# Patient Record
Sex: Male | Born: 1990 | Race: Black or African American | Hispanic: No | Marital: Single | Smoking: Never smoker
Health system: Southern US, Community
[De-identification: ages and names within clinical notes are randomized; demographics above are authoritative.]

## PROBLEM LIST (undated history)

## (undated) DIAGNOSIS — A749 Chlamydial infection, unspecified: Secondary | ICD-10-CM

## (undated) DIAGNOSIS — R062 Wheezing: Secondary | ICD-10-CM

## (undated) HISTORY — DX: Chlamydial infection, unspecified: A74.9

## (undated) HISTORY — DX: Wheezing: R06.2

---
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
## 2010-05-15 IMAGING — CR DG WRIST COMPLETE 3+V*R*
2 series · 2 of 2 positions shown · non-contrast
Comparison: None

CLINICAL DATA: right wrist injury, fall

RIGHT WRIST - COMPLETE 3+ VIEW

[view not recorded (1 of 2)]
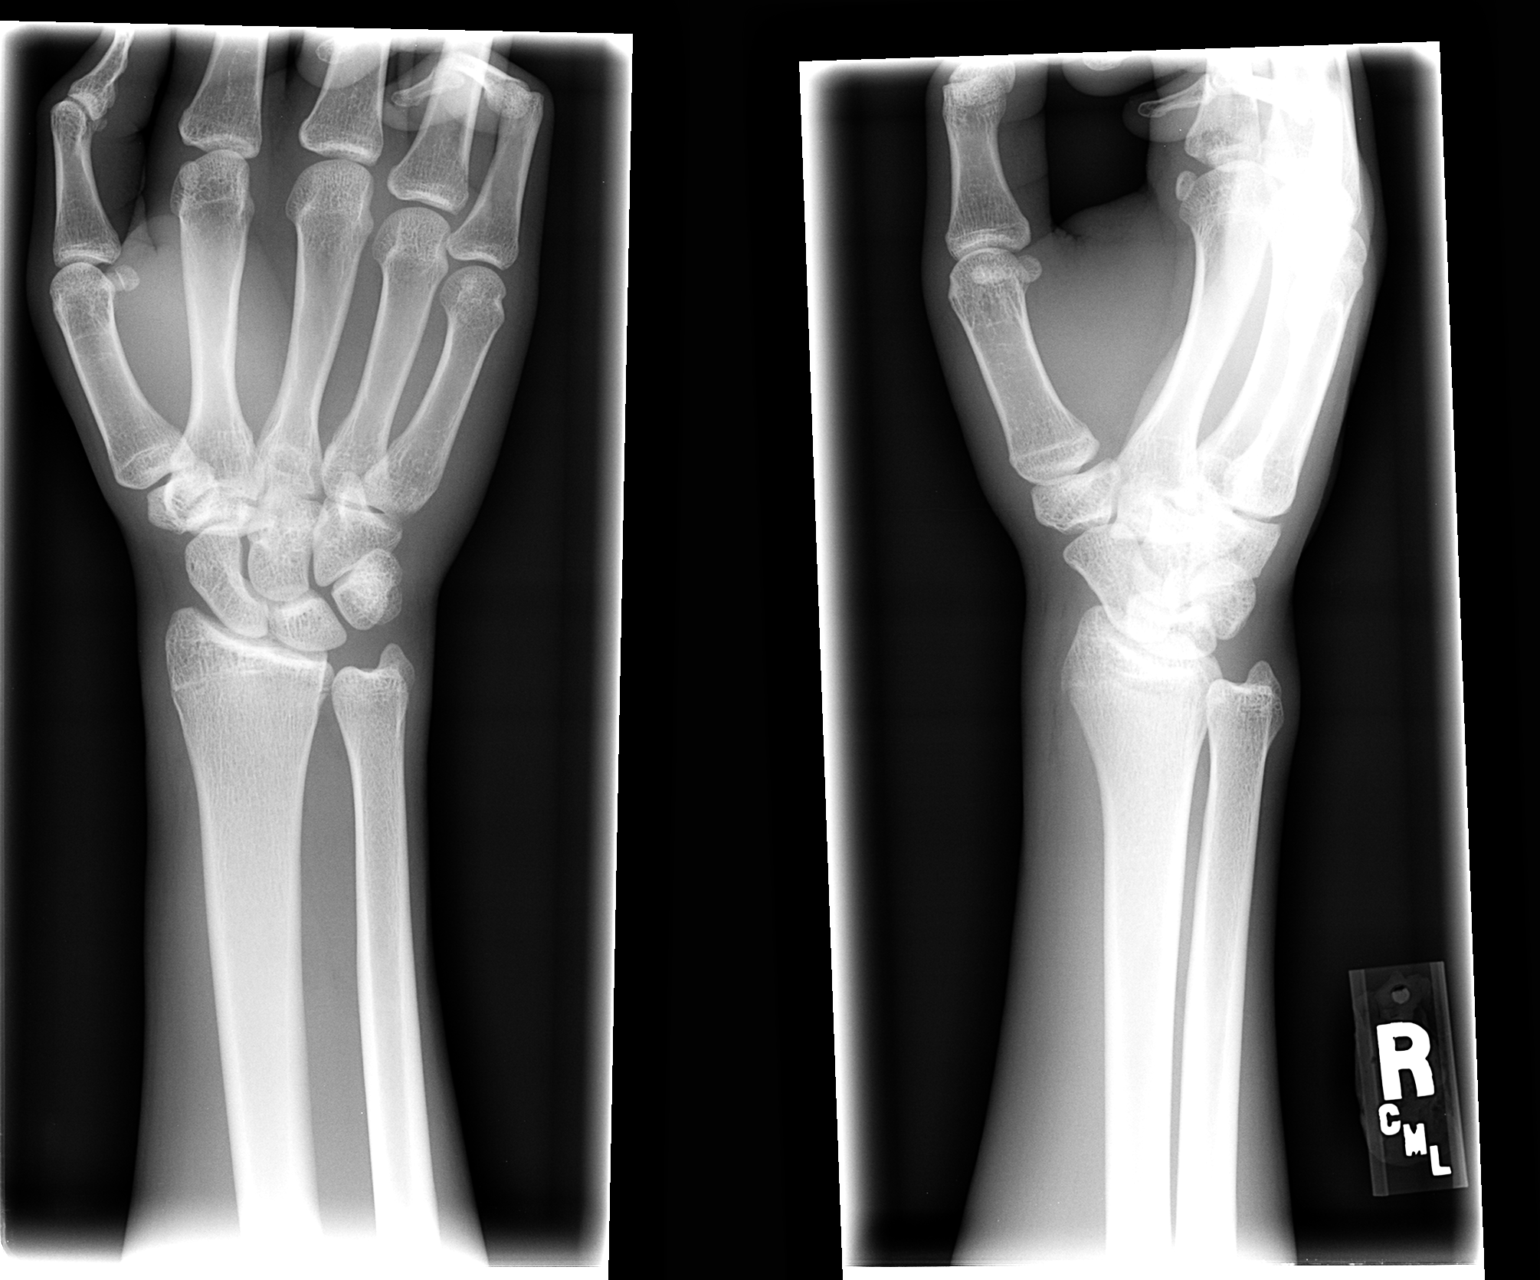

[view not recorded (2 of 2)]
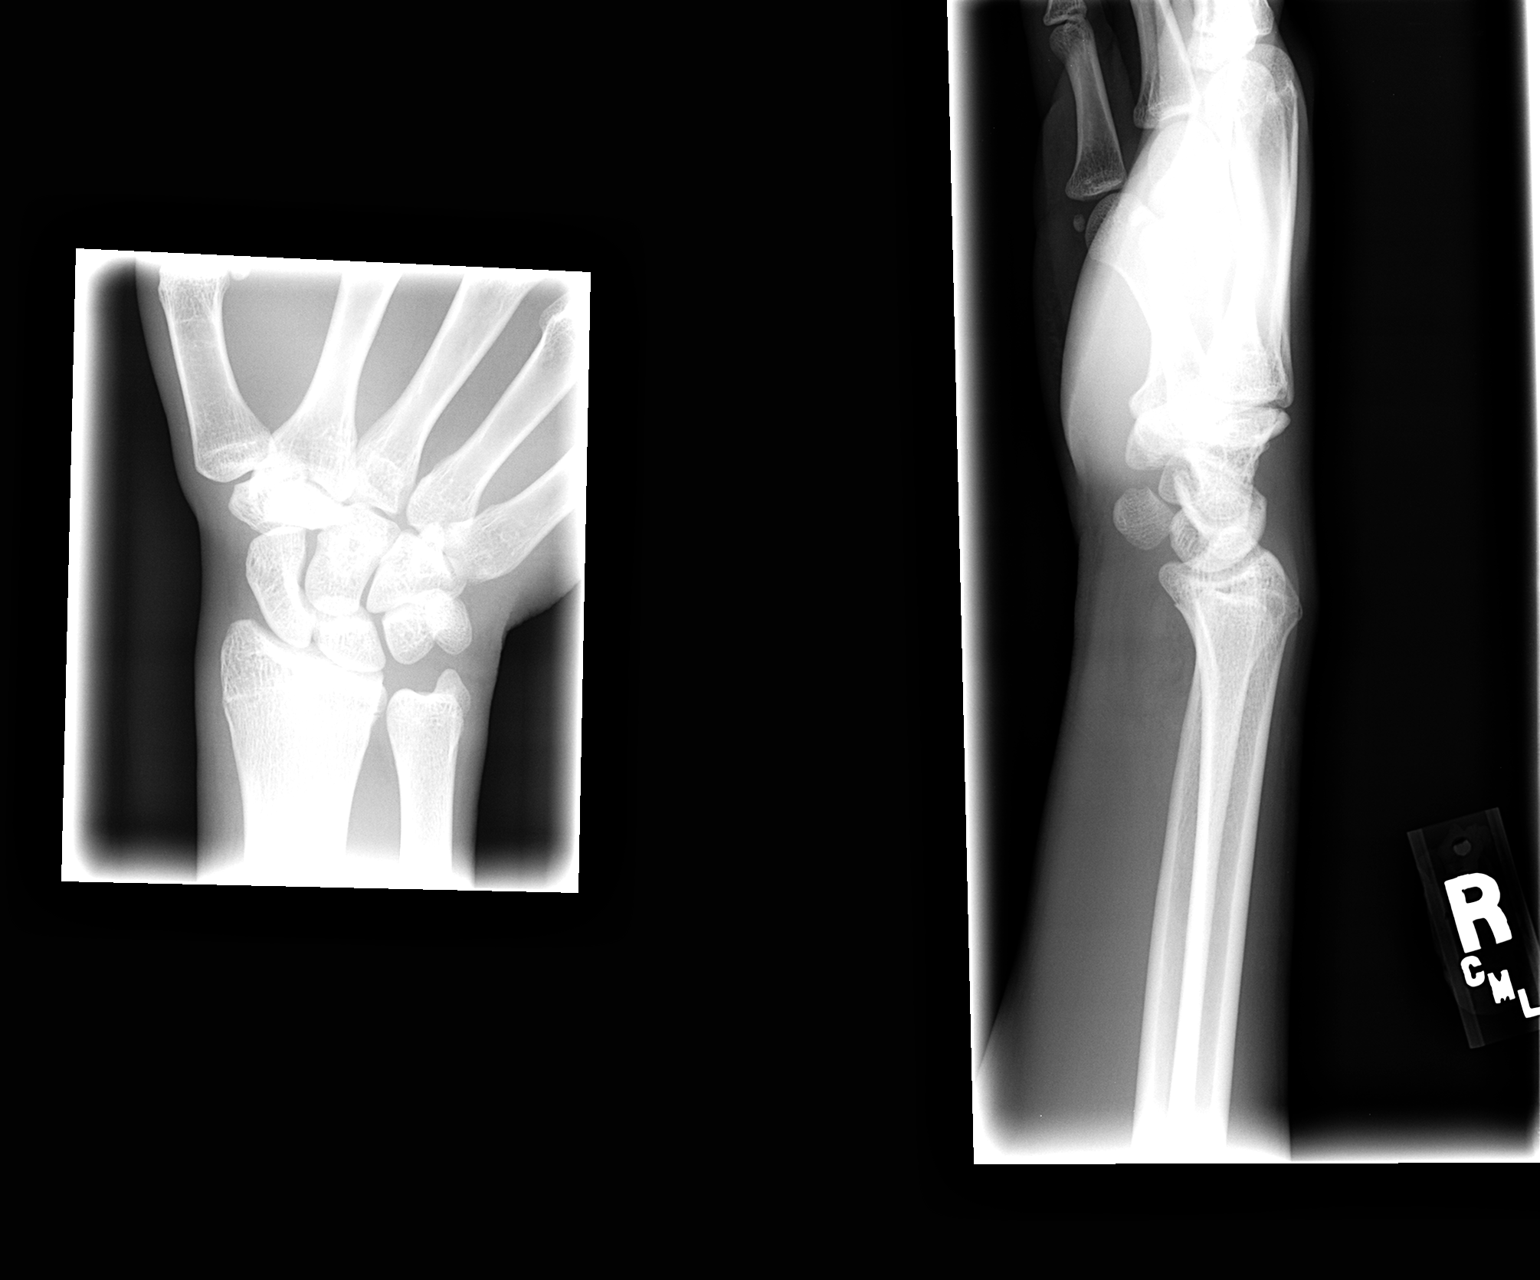

[2 of 2 positions shown; findings below may reference images not displayed]

FINDINGS: Four views of the right wrist shows no acute fracture or
subluxation.  No radiopaque foreign body noted.
IMPRESSION: No right wrist acute fracture or subluxation.

## 2010-09-23 ENCOUNTER — Encounter: Payer: Self-pay | Admitting: Internal Medicine

## 2010-09-23 DIAGNOSIS — A749 Chlamydial infection, unspecified: Secondary | ICD-10-CM | POA: Insufficient documentation

## 2010-09-23 DIAGNOSIS — R062 Wheezing: Secondary | ICD-10-CM | POA: Insufficient documentation

## 2010-09-30 ENCOUNTER — Ambulatory Visit: Payer: Self-pay | Admitting: Internal Medicine

## 2010-10-03 ENCOUNTER — Ambulatory Visit: Payer: Self-pay | Admitting: Internal Medicine

## 2010-10-03 DIAGNOSIS — Z0289 Encounter for other administrative examinations: Secondary | ICD-10-CM

## 2011-10-03 ENCOUNTER — Encounter: Payer: Self-pay | Admitting: Internal Medicine

## 2011-10-04 ENCOUNTER — Encounter: Payer: Self-pay | Admitting: Internal Medicine

## 2011-10-05 ENCOUNTER — Encounter: Payer: Self-pay | Admitting: Internal Medicine

## 2011-10-05 ENCOUNTER — Ambulatory Visit (INDEPENDENT_AMBULATORY_CARE_PROVIDER_SITE_OTHER): Payer: Self-pay | Admitting: Internal Medicine

## 2011-10-05 VITALS — BP 112/76 | HR 68 | Temp 98.3°F | Ht 65.25 in | Wt 120.0 lb

## 2011-10-05 DIAGNOSIS — Z0000 Encounter for general adult medical examination without abnormal findings: Secondary | ICD-10-CM | POA: Insufficient documentation

## 2011-10-05 DIAGNOSIS — G4700 Insomnia, unspecified: Secondary | ICD-10-CM

## 2011-10-05 DIAGNOSIS — Z113 Encounter for screening for infections with a predominantly sexual mode of transmission: Secondary | ICD-10-CM

## 2011-10-05 DIAGNOSIS — Z23 Encounter for immunization: Secondary | ICD-10-CM

## 2011-10-05 LAB — CBC WITH DIFFERENTIAL/PLATELET
Basophils Absolute: 0 10*3/uL (ref 0.0–0.1)
Basophils Relative: 0.5 % (ref 0.0–3.0)
Eosinophils Absolute: 0.1 10*3/uL (ref 0.0–0.7)
Eosinophils Relative: 1.5 % (ref 0.0–5.0)
HCT: 42.2 % (ref 39.0–52.0)
Hemoglobin: 13.8 g/dL (ref 13.0–17.0)
Lymphocytes Relative: 48 % — ABNORMAL HIGH (ref 12.0–46.0)
Lymphs Abs: 1.8 10*3/uL (ref 0.7–4.0)
MCHC: 32.6 g/dL (ref 30.0–36.0)
MCV: 90.7 fl (ref 78.0–100.0)
Monocytes Absolute: 0.4 10*3/uL (ref 0.1–1.0)
Monocytes Relative: 10 % (ref 3.0–12.0)
Neutro Abs: 1.5 10*3/uL (ref 1.4–7.7)
Neutrophils Relative %: 40 % — ABNORMAL LOW (ref 43.0–77.0)
Platelets: 216 10*3/uL (ref 150.0–400.0)
RBC: 4.65 Mil/uL (ref 4.22–5.81)
RDW: 13.6 % (ref 11.5–14.6)
WBC: 3.8 10*3/uL — ABNORMAL LOW (ref 4.5–10.5)

## 2011-10-05 LAB — BASIC METABOLIC PANEL
BUN: 11 mg/dL (ref 6–23)
Chloride: 105 mEq/L (ref 96–112)
Creatinine, Ser: 0.8 mg/dL (ref 0.4–1.5)

## 2011-10-05 LAB — HEPATIC FUNCTION PANEL
ALT: 15 U/L (ref 0–53)
AST: 26 U/L (ref 0–37)
Albumin: 4.4 g/dL (ref 3.5–5.2)
Alkaline Phosphatase: 74 U/L (ref 39–117)
Bilirubin, Direct: 0 mg/dL (ref 0.0–0.3)
Total Bilirubin: 0.4 mg/dL (ref 0.3–1.2)
Total Protein: 7.2 g/dL (ref 6.0–8.3)

## 2011-10-05 LAB — LIPID PANEL
Cholesterol: 126 mg/dL (ref 0–200)
HDL: 47.4 mg/dL
LDL Cholesterol: 65 mg/dL (ref 0–99)
Total CHOL/HDL Ratio: 3
Triglycerides: 66 mg/dL (ref 0.0–149.0)
VLDL: 13.2 mg/dL (ref 0.0–40.0)

## 2011-10-05 LAB — TSH: TSH: 1.49 u[IU]/mL (ref 0.35–5.50)

## 2011-10-05 LAB — T4, FREE: Free T4: 0.89 ng/dL (ref 0.60–1.60)

## 2011-10-05 NOTE — Addendum Note (Signed)
Addended by: Raj Janus on: 10/05/2011 03:47 PM   Modules accepted: Orders

## 2011-10-05 NOTE — Patient Instructions (Signed)
Will notify you  of labs when available. Can try melatonin but may not work. This is OTC.  Would try to wake up an hour earlier every few days and  Not sleep in day except perhaps a power nap if needed( 20- 30 minutes) to try to get your sleep in phase.   Follow up if needed   Get HPV series as we discussed .    Insomnia Insomnia is frequent trouble falling and/or staying asleep. Insomnia can be a long term problem or a short term problem. Both are common. Insomnia can be a short term problem when the wakefulness is related to a certain stress or worry. Long term insomnia is often related to ongoing stress during waking hours and/or poor sleeping habits. Overtime, sleep deprivation itself can make the problem worse. Every little thing feels more severe because you are overtired and your ability to cope is decreased. CAUSES   Stress, anxiety, and depression.   Poor sleeping habits.   Distractions such as TV in the bedroom.   Naps close to bedtime.   Engaging in emotionally charged conversations before bed.   Technical reading before sleep.   Alcohol and other sedatives. They may make the problem worse. They can hurt normal sleep patterns and normal dream activity.   Stimulants such as caffeine for several hours prior to bedtime.   Pain syndromes and shortness of breath can cause insomnia.   Exercise late at night.   Changing time zones may cause sleeping problems (jet lag).  It is sometimes helpful to have someone observe your sleeping patterns. They should look for periods of not breathing during the night (sleep apnea). They should also look to see how long those periods last. If you live alone or observers are uncertain, you can also be observed at a sleep clinic where your sleep patterns will be professionally monitored. Sleep apnea requires a checkup and treatment. Give your caregivers your medical history. Give your caregivers observations your family has made about your sleep.    SYMPTOMS   Not feeling rested in the morning.   Anxiety and restlessness at bedtime.   Difficulty falling and staying asleep.  TREATMENT   Your caregiver may prescribe treatment for an underlying medical disorders. Your caregiver can give advice or help if you are using alcohol or other drugs for self-medication. Treatment of underlying problems will usually eliminate insomnia problems.   Medications can be prescribed for short time use. They are generally not recommended for lengthy use.   Over-the-counter sleep medicines are not recommended for lengthy use. They can be habit forming.   You can promote easier sleeping by making lifestyle changes such as:   Using relaxation techniques that help with breathing and reduce muscle tension.   Exercising earlier in the day.   Changing your diet and the time of your last meal. No night time snacks.   Establish a regular time to go to bed.   Counseling can help with stressful problems and worry.   Soothing music and white noise may be helpful if there are background noises you cannot remove.   Stop tedious detailed work at least one hour before bedtime.  HOME CARE INSTRUCTIONS   Keep a diary. Inform your caregiver about your progress. This includes any medication side effects. See your caregiver regularly. Take note of:   Times when you are asleep.   Times when you are awake during the night.   The quality of your sleep.   How you  feel the next day.  This information will help your caregiver care for you.  Get out of bed if you are still awake after 15 minutes. Read or do some quiet activity. Keep the lights down. Wait until you feel sleepy and go back to bed.   Keep regular sleeping and waking hours. Avoid naps.   Exercise regularly.   Avoid distractions at bedtime. Distractions include watching television or engaging in any intense or detailed activity like attempting to balance the household checkbook.   Develop a  bedtime ritual. Keep a familiar routine of bathing, brushing your teeth, climbing into bed at the same time each night, listening to soothing music. Routines increase the success of falling to sleep faster.   Use relaxation techniques. This can be using breathing and muscle tension release routines. It can also include visualizing peaceful scenes. You can also help control troubling or intruding thoughts by keeping your mind occupied with boring or repetitive thoughts like the old concept of counting sheep. You can make it more creative like imagining planting one beautiful flower after another in your backyard garden.   During your day, work to eliminate stress. When this is not possible use some of the previous suggestions to help reduce the anxiety that accompanies stressful situations.  MAKE SURE YOU:   Understand these instructions.   Will watch your condition.   Will get help right away if you are not doing well or get worse.  Document Released: 03/31/2000 Document Revised: 03/23/2011 Document Reviewed: 05/01/2007 [facility] Patient Information 2012 [place], [place].

## 2011-10-05 NOTE — Progress Notes (Signed)
Subjective:    Patient ID: Arthur Lewis, male    DOB: 01-Jan-1991, 21 y.o.   MRN: [id number]  HPI Patient comes in today for Preventive Health Care visit . Since his last visit he has done fairly well however over the last couple of months he's had a problem with sleep. No travel or illness related. He does go to school during the school year and works the summer he is not in school. Works [employer] officer, [employer]  For a year.   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   Usually bed pre midnight but  Hard to sleep  When tired.   ocass nap.   Gets   Read or tv.  Gets up at 12 noon.  Works most days of the week except for Sundays. Tends to sleep most of the day then. Denies any specific anxiety stress illness physical discomfort with this. This tried to make his room dark doesn't drink a lot of caffeine no alcohol. Once STI screen no new tattoos or piercings no high risk partners  3/6 months.100 %  Review of Systems ROS:  GEN/ HEENT: No fever, significant weight changes sweats headaches vision problems hearing changes, CV/ PULM; No chest pain shortness of breath cough, syncope,edema  change in exercise tolerance. GI /GU: No adominal pain, vomiting, change in bowel habits. No blood in the stool. No significant GU symptoms. SKIN/HEME: ,no acute skin rashes suspicious lesions or bleeding. No lymphadenopathy, nodules, masses.  NEURO/ PSYCH:  No neurologic signs such as weakness numbness. No depression anxiety. IMM/ Allergy: No unusual infections.  Allergy .   REST of 12 system review negative except as per HPI  Past history family history social history reviewed in the electronic medical record.      Objective:   Physical Exam BP 112/76  Pulse 68  Temp 98.3 F (36.8 C) (Oral)  Ht 5' 5.25" (1.657 m)  Wt 120 lb (54.432 kg)  BMI 19.82 kg/m2  SpO2 98% Wt Readings from Last 3 Encounters:  10/05/11 120 lb (54.432 kg)  04/07/09 116 lb (52.617 kg) (2.70%*)  01/17/08 113 lb 8 oz (51.483 kg) (3.90%*)   * Growth  percentiles are based on CDC 2-20 Years data.   Ht Readings from Last 3 Encounters:  10/05/11 5' 5.25" (1.657 m)  04/07/09 5\' 5"  (1.651 m) (5.60%*)  01/17/08 5\' 5"  (1.651 m) (7.04%*)   * Growth percentiles are based on CDC 2-20 Years data.   Body mass index is 19.82 kg/(m^2). @BMIFA @ Normalized weight-for-age data available only for age 21 to 20 years. Normalized stature-for-age data available only for age 21 to 20 years. Physical Exam: Vital signs reviewed WUJ:WJXB is a well-developed well-nourished alert cooperative  AAmale  who appears   stated age in no acute distress.  HEENT: normocephalic  traumatic , Eyes: PERRL EOM's full, conjunctiva clear, Nares: patent no deformity discharge or tenderness., Ears: no deformity EAC's clear TMs with normal landmarks. Mouth: clear OP, no lesions, edema.  Moist mucous membranes. Dentition in adequate repair. NECK: supple without masses, thyromegaly or bruits. CHEST/PULM:  Clear to auscultation and percussion breath sounds equal no wheeze , rales or rhonchi. No chest wall deformities or tenderness. CV: PMI is nondisplaced, S1 S2 no gallops, murmurs, rubs. Peripheral pulses are full without delay.No JVD .  ABDOMEN: Bowel sounds normal nontender  No guard or rebound, no hepato splenomegal no CVA tenderness.  No hernia. Extremtities:  No clubbing cyanosis or edema, no acute joint swelling or redness no focal atrophy  NEURO:  Oriented x3, cranial nerves 3-12 appear to be intact, no obvious focal weakness,gait within normal limits no abnormal reflexes or asymmetrical SKIN: No acute rashes normal turgor, color, no bruising or petechiae. Tattoos some piercings ;o infection PSYCH: Oriented, good eye contact, no obvious depression anxiety, cognition and judgment appear normal. LN:  No cervical axillary or inguinal adenopathy Ext gu test down no hernia masses  Slightly prominent cord on right.     Assessment & Plan:  Preventive Health Care Counseled regarding  healthy nutrition, exercise, sleep, injury prevention, calcium vit d and healthy weight .  HPV series discussed   hpv 1 today. Screening labs today. Discussion and counseling regarding sleep and sleep phase. Try to bring his arising hour earlier and earlier to be able to sleep earlier. Reviewed sleep hygiene and avoid clock watching. See how he does or followup if not doing well with this

## 2011-10-06 LAB — RPR

## 2017-08-07 ENCOUNTER — Encounter (HOSPITAL_COMMUNITY): Payer: Self-pay

## 2017-08-07 ENCOUNTER — Other Ambulatory Visit: Payer: Self-pay

## 2017-08-07 ENCOUNTER — Emergency Department (HOSPITAL_COMMUNITY)
Admission: EM | Admit: 2017-08-07 | Discharge: 2017-08-07 | Disposition: A | Discharge status: Home / Self Care | Payer: Self-pay | Attending: Emergency Medicine | Admitting: Emergency Medicine

## 2017-08-07 DIAGNOSIS — J209 Acute bronchitis, unspecified: Secondary | ICD-10-CM | POA: Insufficient documentation

## 2017-08-07 DIAGNOSIS — J029 Acute pharyngitis, unspecified: Secondary | ICD-10-CM | POA: Insufficient documentation

## 2017-08-07 DIAGNOSIS — R51 Headache: Secondary | ICD-10-CM | POA: Insufficient documentation

## 2017-08-07 DIAGNOSIS — R0981 Nasal congestion: Secondary | ICD-10-CM | POA: Insufficient documentation

## 2017-08-07 MED ORDER — DM-GUAIFENESIN ER 30-600 MG PO TB12: 1.0000 | ORAL_TABLET | Freq: Two times a day (BID) | ORAL | 0 refills | Status: AC

## 2017-08-07 MED ORDER — AEROCHAMBER PLUS FLO-VU MEDIUM MISC
1.0000 | Freq: Once | Status: AC
  Administered 2017-08-07: 1
  Filled 2017-08-07: qty 1

## 2017-08-07 MED ORDER — ALBUTEROL SULFATE HFA 108 (90 BASE) MCG/ACT IN AERS
2.0000 | INHALATION_SPRAY | Freq: Four times a day (QID) | RESPIRATORY_TRACT | Status: DC
  Administered 2017-08-07: 2 via RESPIRATORY_TRACT
  Filled 2017-08-07: qty 6.7

## 2017-08-07 MED ORDER — CETIRIZINE HCL 10 MG PO TABS: 10.0000 mg | ORAL_TABLET | Freq: Every day | ORAL | 0 refills | Status: AC

## 2017-08-07 MED ORDER — BENZONATATE 100 MG PO CAPS: 100.0000 mg | ORAL_CAPSULE | Freq: Three times a day (TID) | ORAL | 0 refills | Status: AC

## 2017-08-07 NOTE — Discharge Instructions (Signed)
Please read and follow all provided instructions.  Your diagnoses today include:  1. Acute bronchitis, unspecified organism     You appear to have an upper respiratory infection (URI). An upper respiratory tract infection, or cold, is a viral infection of the air passages leading to the lungs. It should improve gradually after 5-7 days. You may have a lingering cough that lasts for 2- 4 weeks after the infection.  Tests performed today include: Vital signs. See below for your results today.   Medications prescribed:   Take any prescribed medications only as directed. Treatment for your infection is aimed at treating the symptoms. There are no medications, such as antibiotics, that will cure your infection.   Mucinex DM.  Please take 1-2 tablets twice daily for 5-7 days.  Please schedule the albuterol treatments every 6 hours, 1-2 puffs in the inhaler as long as cough persists.  You may use the Tessalon Perles, up to 3 times a day.  Please make sure that this medication stays away from children as it can be toxic to them and overdose.  Zyrtec.  This is a medication to stop histamine which can cause allergies.  You will take 1 pill, 10 mg at night.  Home care instructions:  Follow any educational materials contained in this packet.   Your illness is contagious and can be spread to others, especially during the first 3 or 4 days. It cannot be cured by antibiotics or other medicines. Take basic precautions such as washing your hands often, covering your mouth when you cough or sneeze, and avoiding public places where you could spread your illness to others.   Please continue drinking plenty of fluids.  Use over-the-counter medicines as needed as directed on packaging for symptom relief.  You may also use ibuprofen or tylenol as directed on packaging for pain or fever.  Do not take multiple medicines containing Tylenol or acetaminophen to avoid taking too much of this medication.  Follow-up  instructions: Please follow-up with your primary care provider in the next 3 days for further evaluation of your symptoms if you are not feeling better.   Return instructions:  Please return to the Emergency Department if you experience worsening symptoms.  RETURN IMMEDIATELY IF you develop shortness of breath, chest pain, confusion or altered mental status, a new rash, become dizzy, faint, or poorly responsive, or are unable to be cared for at home. Please return if you have persistent vomiting and cannot keep down fluids or develop a fever that is not controlled by tylenol or motrin.   Please return if you have any other emergent concerns.  Additional Information: My general rule her returning to work after illness is 3 days without fever, not on Aleve, Motrin, or Tylenol.  Your vital signs today were: BP 118/80 (BP Location: Right Arm)    Pulse 61    Temp 98.1 F (36.7 C) (Oral)    Resp 19    Ht 5\' 6"  (1.676 m)    Wt 61.2 kg (135 lb)    SpO2 100%    BMI 21.79 kg/m  If your blood pressure (BP) was elevated above 135/85 this visit, please have this repeated by your doctor within one month. --------------

## 2017-08-07 NOTE — ED Triage Notes (Signed)
Patient c/o a productive cough with green sputum and a sore throat x 1 1/2 weeks. Patient works in [employer] and was asked to come to the ED.

## 2017-08-07 NOTE — ED Provider Notes (Signed)
[facility]-EMERGENCY DEPT Provider Note   CSN: [id number] Arrival date & time: 08/07/17  1107     History   Chief Complaint Chief Complaint  Patient presents with  . Cough  . Sore Throat    HPI Arthur Lewis is a 27 y.o. male.  HPI  Patient is a 28 year old male with no significant past medical history no history of immunocompromise status presenting for productive cough, and frontal headaches.  Patient reports that his symptoms began with a sore throat approximately 1.5 weeks ago, which has resolved.  Patient reports he developed some nasal congestion which is improving, but he continues to have a cough productive of green to yellow sputum.  He denies wheezing or shortness of breath.  Patient denies any facial pain.  Patient reports that he may have had a fever at the onset of his illness, but is resolved at this point.  Patient reports that headache is in the frontotemporal region, and is not associated with visual disturbance, dizziness, or weakness or numbness.  Patient reports he has been trying over-the-counter TheraFlu as well as NyQuil for his symptoms without full relief.  Patient was advised to come to the emergency department at the request of his work for clearance.  Past Medical History:  Diagnosis Date  . Chlamydia 2009   rx given  . Wheezing    when younger no asthma    Patient Active Problem List   Diagnosis Date Noted  . Screening for STD (sexually transmitted disease) 10/05/2011  . Visit for preventive health examination 10/05/2011  . Insomnia, unspecified 10/05/2011  . Wheezing   . Chlamydia   . NEUTROPENIA UNSPECIFIED 04/07/2009    History reviewed. No pertinent surgical history.      Home Medications    Prior to Admission medications   Medication Sig Start Date End Date Taking? Authorizing Provider  benzonatate (TESSALON) 100 MG capsule Take 1 capsule (100 mg total) by mouth every 8 (eight) hours. 08/07/17   Aviva Kluver B,  PA-C  cetirizine (ZYRTEC) 10 MG tablet Take 1 tablet (10 mg total) by mouth daily. 08/07/17 09/06/17  Aviva Kluver B, PA-C  dextromethorphan-guaiFENesin (MUCINEX DM) 30-600 MG 12hr tablet Take 1 tablet by mouth 2 (two) times daily for 7 days. 08/07/17 08/14/17  Elisha Ponder, PA-C    Family History Family History  Problem Relation Age of Onset  . Cancer Mother   . Asthma Neg Hx        sibling  . Diabetes Neg Hx        family hx    Social History Social History   Tobacco Use  . Smoking status: Never Smoker  . Smokeless tobacco: Never Used  Substance Use Topics  . Alcohol use: Yes  . Drug use: Never     Allergies   Patient has no known allergies.   Review of Systems Review of Systems  Constitutional: Negative for chills and fever.  HENT: Positive for rhinorrhea. Negative for congestion, ear pain, sore throat, trouble swallowing and voice change.   Eyes: Negative for visual disturbance.  Respiratory: Positive for cough. Negative for shortness of breath and stridor.   Gastrointestinal: Negative for nausea and vomiting.  Musculoskeletal: Negative for myalgias.  Neurological: Positive for headaches.     Physical Exam Updated Vital Signs BP 118/80 (BP Location: Right Arm)   Pulse 61   Temp 98.1 F (36.7 C) (Oral)   Resp 19   Ht 5\' 6"  (1.676 m)   Wt  61.2 kg (135 lb)   SpO2 100%   BMI 21.79 kg/m   Physical Exam  Constitutional: He appears well-developed and well-nourished. No distress.  Sitting comfortably in bed.  HENT:  Head: Normocephalic and atraumatic.  Normal phonation. No muffled voice sounds. Patient swallows secretions without difficulty. Dentition normal. No lesions of tongue or buccal mucosa. Uvula midline. No asymmetric swelling of the posterior pharynx.+ Erythema of posterior pharynx. No tonsillar exuduate. No lingual swelling. No induration inferior to tongue. No submandibular tenderness, swelling, or induration.  Tissues of the neck supple. No  cervical lymphadenopathy. Right TM without erythema or effusion; left TM without erythema or effusion.  Eyes: Conjunctivae are normal. Right eye exhibits no discharge. Left eye exhibits no discharge.  EOMs normal to gross examination.  Neck: Normal range of motion.  Cardiovascular: Normal rate, regular rhythm and normal heart sounds.  Pulmonary/Chest: Effort normal and breath sounds normal. He has no wheezes.  Normal respiratory effort. Patient converses comfortably. No audible wheeze or stridor. Rhonchi present in bilateral upper lung fields.  Abdominal: He exhibits no distension.  Musculoskeletal: Normal range of motion.  Neurological: He is alert.  Cranial nerves intact to gross observation. Patient moves extremities without difficulty.  Skin: Skin is warm and dry. He is not diaphoretic.  Psychiatric: He has a normal mood and affect. His behavior is normal. Judgment and thought content normal.  Nursing note and vitals reviewed.    ED Treatments / Results  Labs (all labs ordered are listed, but only abnormal results are displayed) Labs Reviewed - No data to display  EKG None  Radiology No results found.  Procedures Procedures (including critical care time)  Medications Ordered in ED Medications  albuterol (PROVENTIL HFA;VENTOLIN HFA) 108 (90 Base) MCG/ACT inhaler 2 puff (2 puffs Inhalation Given 08/07/17 1456)  AEROCHAMBER PLUS FLO-VU MEDIUM MISC 1 each (1 each Other Given 08/07/17 1456)     Initial Impression / Assessment and Plan / ED Course  I have reviewed the triage vital signs and the nursing notes.  Pertinent labs & imaging results that were available during my care of the patient were reviewed by me and considered in my medical decision making (see chart for details).     Patient nontoxic-appearing, afebrile, and in no acute distress.  Patient with symptoms consistent with a viral syndrome. Vitals are stable, no fever. No signs of dehydration. Lung exam normal,  no signs of pneumonia.  No signs of posterior pharynx erythema at this time, RPA, or PTA.  Patient may be exhibiting some allergic symptoms, so we will add cetirizine to regimen.  Dispensed albuterol inhaler, as well as prescribed Mucinex DM and Tessalon Perles.  Supportive therapy indicated with return if symptoms worsen.  Patient in understanding and agrees with the plan of care.   Final Clinical Impressions(s) / ED Diagnoses   Final diagnoses:  Acute bronchitis, unspecified organism    ED Discharge Orders        Ordered    dextromethorphan-guaiFENesin [facility](MUCINEX DM) 30-600 MG 12hr tablet  2 times daily     08/07/17 1514    cetirizine (ZYRTEC) 10 MG tablet  Daily     08/07/17 1514    benzonatate (TESSALON) 100 MG capsule  Every 8 hours     08/07/17 1514       Delia ChimesMurray, Mitul Hallowell B, PA-C 08/07/17 1538    Nira Connardama, Pedro Eduardo, MD 08/08/17 [id number]

## 2020-03-06 ENCOUNTER — Encounter (HOSPITAL_COMMUNITY): Payer: Self-pay | Admitting: Emergency Medicine

## 2020-03-06 ENCOUNTER — Ambulatory Visit (HOSPITAL_COMMUNITY)
Admission: EM | Admit: 2020-03-06 | Discharge: 2020-03-06 | Disposition: A | Discharge status: Home / Self Care | Payer: Self-pay | Attending: Family Medicine | Admitting: Family Medicine

## 2020-03-06 ENCOUNTER — Other Ambulatory Visit: Payer: Self-pay

## 2020-03-06 DIAGNOSIS — R1010 Upper abdominal pain, unspecified: Secondary | ICD-10-CM

## 2020-03-06 MED ORDER — CYCLOBENZAPRINE HCL 10 MG PO TABS: 10.0000 mg | ORAL_TABLET | Freq: Three times a day (TID) | ORAL | 0 refills | Status: AC | PRN
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
## 2022-10-23 ENCOUNTER — Ambulatory Visit: Payer: Self-pay | Admitting: Family Medicine

## 2023-07-17 ENCOUNTER — Ambulatory Visit: Payer: Self-pay | Admitting: Family Medicine

## 2024-01-08 ENCOUNTER — Telehealth

## 2024-01-08 DIAGNOSIS — J208 Acute bronchitis due to other specified organisms: Secondary | ICD-10-CM | POA: Diagnosis not present

## 2024-01-08 MED ORDER — ALBUTEROL SULFATE HFA 108 (90 BASE) MCG/ACT IN AERS: 1.0000 | INHALATION_SPRAY | Freq: Four times a day (QID) | RESPIRATORY_TRACT | 0 refills | Status: AC | PRN

## 2024-01-08 MED ORDER — PSEUDOEPH-BROMPHEN-DM 30-2-10 MG/5ML PO SYRP: 5.0000 mL | ORAL_SOLUTION | Freq: Four times a day (QID) | ORAL | 0 refills | Status: AC | PRN

## 2024-01-08 MED ORDER — PREDNISONE 20 MG PO TABS: 40.0000 mg | ORAL_TABLET | Freq: Every day | ORAL | 0 refills | Status: AC

## 2024-01-08 NOTE — Progress Notes (Signed)
 Virtual Visit Consent   Arthur Lewis, you are scheduled for a virtual visit with a [facility] provider today. Just as with appointments in the office, your consent must be obtained to participate. Your consent will be active for this visit and any virtual visit you may have with one of our providers in the next 365 days. If you have a MyChart account, a copy of this consent can be sent to you electronically.  As this is a virtual visit, video technology does not allow for your provider to perform a traditional examination. This may limit your provider's ability to fully assess your condition. If your provider identifies any concerns that need to be evaluated in person or the need to arrange testing (such as labs, EKG, etc.), we will make arrangements to do so. Although advances in technology are sophisticated, we cannot ensure that it will always work on either your end or our end. If the connection with a video visit is poor, the visit may have to be switched to a telephone visit. With either a video or telephone visit, we are not always able to ensure that we have a secure connection.  By engaging in this virtual visit, you consent to the provision of healthcare and authorize for your insurance to be billed (if applicable) for the services provided during this visit. Depending on your insurance coverage, you may receive a charge related to this service.  I need to obtain your verbal consent now. Are you willing to proceed with your visit today? Derward D Billick has provided verbal consent on 01/08/2024 for a virtual visit (video or telephone). Delon CHRISTELLA Dickinson, PA-C  Date: 01/08/2024 8:25 AM   Virtual Visit via Video Note   I, Delon CHRISTELLA Dickinson, connected with  Arthur Lewis  ([id number], [birth year]) on 01/08/24 at  8:15 AM EDT by a video-enabled telemedicine application and verified that I am speaking with the correct person using two identifiers.  Location: Patient: Virtual Visit Location  Patient: Home Provider: Virtual Visit Location Provider: Home Office   I discussed the limitations of evaluation and management by telemedicine and the availability of in person appointments. The patient expressed understanding and agreed to proceed.    History of Present Illness: Arthur Lewis is a 33 y.o. who identifies as a male who was assigned male at birth, and is being seen today for cough and congestion.  HPI: URI  This is a new problem. The current episode started in the past 7 days. The problem has been unchanged. There has been no fever. Associated symptoms include congestion, coughing (productive of green mucus), headaches, a sore throat and wheezing (beginning but improving with medications). Pertinent negatives include no chest pain, diarrhea, ear pain, nausea, plugged ear sensation, rhinorrhea, sinus pain or vomiting. Associated symptoms comments: Fatigue, chills, post nasal drainage. Treatments tried: dayquil, nyquil, theraflu. The treatment provided no relief.     Problems:  Patient Active Problem List   Diagnosis Date Noted   Screening for STD (sexually transmitted disease) 10/05/2011   Visit for preventive health examination 10/05/2011   Insomnia, unspecified 10/05/2011   Wheezing    Chlamydia    NEUTROPENIA UNSPECIFIED 04/07/2009    Allergies: No Known Allergies Medications:  Current Outpatient Medications:    albuterol  (VENTOLIN  HFA) 108 (90 Base) MCG/ACT inhaler, Inhale 1-2 puffs into the lungs every 6 (six) hours as needed., Disp: 8 g, Rfl: 0   brompheniramine-pseudoephedrine-DM 30-2-10 MG/5ML syrup, Take 5 mLs by mouth 4 (four) times  daily as needed., Disp: 120 mL, Rfl: 0   predniSONE  (DELTASONE ) 20 MG tablet, Take 2 tablets (40 mg total) by mouth daily with breakfast., Disp: 10 tablet, Rfl: 0   benzonatate  (TESSALON ) 100 MG capsule, Take 1 capsule (100 mg total) by mouth every 8 (eight) hours., Disp: 21 capsule, Rfl: 0   cetirizine  (ZYRTEC ) 10 MG tablet, Take 1  tablet (10 mg total) by mouth daily., Disp: 30 tablet, Rfl: 0   cyclobenzaprine  (FLEXERIL ) 10 MG tablet, Take 1 tablet (10 mg total) by mouth 3 (three) times daily as needed for muscle spasms. DO NOT DRINK ALCOHOL OR DRIVE WHILE TAKING THIS MEDICATION, Disp: 30 tablet, Rfl: 0  Observations/Objective: Patient is well-developed, well-nourished in no acute distress.  Resting comfortably at home.  Head is normocephalic, atraumatic.  No labored breathing.  Speech is clear and coherent with logical content.  Patient is alert and oriented at baseline.    Assessment and Plan: 1. Viral bronchitis (Primary) - brompheniramine-pseudoephedrine-DM 30-2-10 MG/5ML syrup; Take 5 mLs by mouth 4 (four) times daily as needed.  Dispense: 120 mL; Refill: 0 - predniSONE  (DELTASONE ) 20 MG tablet; Take 2 tablets (40 mg total) by mouth daily with breakfast.  Dispense: 10 tablet; Refill: 0 - albuterol  (VENTOLIN  HFA) 108 (90 Base) MCG/ACT inhaler; Inhale 1-2 puffs into the lungs every 6 (six) hours as needed.  Dispense: 8 g; Refill: 0  - Suspect viral URI - Symptomatic medications of choice over the counter as needed - Added Bromfed DM for cough - Prednisone  and Albuterol  for wheezing and shortness of breath - Push fluids - Rest - Seek further evaluation if symptoms change or worsen   Follow Up Instructions: I discussed the assessment and treatment plan with the patient. The patient was provided an opportunity to ask questions and all were answered. The patient agreed with the plan and demonstrated an understanding of the instructions.  A copy of instructions were sent to the patient via MyChart unless otherwise noted below.    The patient was advised to call back or seek an in-person evaluation if the symptoms worsen or if the condition fails to improve as anticipated.    Delon CHRISTELLA Dickinson, PA-C

## 2024-01-08 NOTE — Patient Instructions (Signed)
 Anatole D Luttrull, thank you for joining Delon CHRISTELLA Dickinson, PA-C for today's virtual visit.  While this provider is not your primary care provider (PCP), if your PCP is located in our provider database this encounter information will be shared with them immediately following your visit.   A [facility] MyChart account gives you access to today's visit and all your visits, tests, and labs performed at [facility]  click here if you don't have a [facility] MyChart account or go to [email or web address]  Consent: (Patient) Arthur Lewis provided verbal consent for this virtual visit at the beginning of the encounter.  Current Medications:  Current Outpatient Medications:    albuterol  (VENTOLIN  HFA) 108 (90 Base) MCG/ACT inhaler, Inhale 1-2 puffs into the lungs every 6 (six) hours as needed., Disp: 8 g, Rfl: 0   brompheniramine-pseudoephedrine-DM 30-2-10 MG/5ML syrup, Take 5 mLs by mouth 4 (four) times daily as needed., Disp: 120 mL, Rfl: 0   predniSONE  (DELTASONE ) 20 MG tablet, Take 2 tablets (40 mg total) by mouth daily with breakfast., Disp: 10 tablet, Rfl: 0   benzonatate  (TESSALON ) 100 MG capsule, Take 1 capsule (100 mg total) by mouth every 8 (eight) hours., Disp: 21 capsule, Rfl: 0   cetirizine  (ZYRTEC ) 10 MG tablet, Take 1 tablet (10 mg total) by mouth daily., Disp: 30 tablet, Rfl: 0   cyclobenzaprine  (FLEXERIL ) 10 MG tablet, Take 1 tablet (10 mg total) by mouth 3 (three) times daily as needed for muscle spasms. DO NOT DRINK ALCOHOL OR DRIVE WHILE TAKING THIS MEDICATION, Disp: 30 tablet, Rfl: 0   Medications ordered in this encounter:  Meds ordered this encounter  Medications   brompheniramine-pseudoephedrine-DM 30-2-10 MG/5ML syrup    Sig: Take 5 mLs by mouth 4 (four) times daily as needed.    Dispense:  120 mL    Refill:  0    Supervising Provider:   BLAISE ALEENE KIDD [[id number]]   predniSONE  (DELTASONE ) 20 MG tablet    Sig: Take 2 tablets (40 mg total) by mouth daily  with breakfast.    Dispense:  10 tablet    Refill:  0    Supervising Provider:   LAMPTEY, PHILIP O [[id number]]   albuterol  (VENTOLIN  HFA) 108 (90 Base) MCG/ACT inhaler    Sig: Inhale 1-2 puffs into the lungs every 6 (six) hours as needed.    Dispense:  8 g    Refill:  0    Supervising Provider:   BLAISE ALEENE KIDD [[id number]]     *If you need refills on other medications prior to your next appointment, please contact your pharmacy*  Follow-Up: Call back or seek an in-person evaluation if the symptoms worsen or if the condition fails to improve as anticipated.  [facility] [phone]  Other Instructions Viral Respiratory Infection A respiratory infection is an illness that affects part of the respiratory system, such as the lungs, nose, or throat. A respiratory infection that is caused by a virus is called a viral respiratory infection. Common types of viral respiratory infections include: A cold. The flu (influenza). A respiratory syncytial virus (RSV) infection. What are the causes? This condition is caused by a virus. The virus may spread through contact with droplets or direct contact with infected people or their mucus or secretions. The virus may spread from person to person (is contagious). What are the signs or symptoms? Symptoms of this condition include: A stuffy or runny nose. A sore throat or cough. Shortness of breath or difficulty  breathing. Yellow or green mucus (sputum). Other symptoms may include: A fever. Sweating or chills. Fatigue. Achy muscles. A headache. How is this diagnosed? This condition may be diagnosed based on: Your symptoms. A physical exam. Testing of secretions from the nose or throat. Chest X-ray. How is this treated? This condition may be treated with medicines, such as: Antiviral medicine. This may shorten the length of time a person has symptoms. Expectorants. These make it easier to cough up mucus. Decongestant nasal  sprays. Acetaminophen or NSAIDs, such as ibuprofen, to relieve fever and pain. Antibiotic medicines are not prescribed for viral infections.This is because antibiotics are designed to kill bacteria. They do not kill viruses. Follow these instructions at home: Managing pain and congestion Take over-the-counter and prescription medicines only as told by your health care provider. If you have a sore throat, gargle with a mixture of salt and water 3-4 times a day or as needed. To make salt water, completely dissolve -1 tsp (3-6 g) of salt in 1 cup (237 mL) of warm water. Use nose drops made from salt water to ease congestion and soften raw skin around your nose. Take 2 tsp (10 mL) of honey at bedtime to lessen coughing at night. Do not give honey to children who are younger than 1 year. Drink enough fluid to keep your urine pale yellow. This helps prevent dehydration and helps loosen up mucus. General instructions  Rest as much as possible. Do not drink alcohol. Do not use any products that contain nicotine or tobacco. These products include cigarettes, chewing tobacco, and vaping devices, such as e-cigarettes. If you need help quitting, ask your health care provider. Keep all follow-up visits. This is important. How is this prevented?     Get an annual flu shot. You may get the flu shot in late summer, fall, or winter. Ask your health care provider when you should get your flu shot. Avoid spreading your infection to other people. If you are sick: Wash your hands with soap and water often, especially after you cough or sneeze. Wash for at least 20 seconds. If soap and water are not available, use alcohol-based hand sanitizer. Cover your mouth when you cough. Cover your nose and mouth when you sneeze. Do not share cups or eating utensils. Clean commonly used objects often. Clean commonly touched surfaces. Stay home from work or school as told by your health care provider. Avoid contact with  people who are sick during cold and flu season. This is generally fall and winter. Contact a health care provider if: Your symptoms last for 10 days or longer. Your symptoms get worse over time. You have severe sinus pain in your face or forehead. The glands in your jaw or neck become very swollen. You have shortness of breath. Get help right away if you: Feel pain or pressure in your chest. Have trouble breathing. Faint or feel like you will faint. Have severe and persistent vomiting. Feel confused or disoriented. These symptoms may represent a serious problem that is an emergency. Do not wait to see if the symptoms will go away. Get medical help right away. Call your local emergency services (911 in the [place] Do not drive yourself to the hospital. Summary A respiratory infection is an illness that affects part of the respiratory system, such as the lungs, nose, or throat. A respiratory infection that is caused by a virus is called a viral respiratory infection. Common types of viral respiratory infections include a cold, influenza, and  respiratory syncytial virus (RSV) infection. Symptoms of this condition include a stuffy or runny nose, cough, fatigue, achy muscles, sore throat, and fevers or chills. Antibiotic medicines are not prescribed for viral infections. This is because antibiotics are designed to kill bacteria. They are not effective against viruses. This information is not intended to replace advice given to you by your health care provider. Make sure you discuss any questions you have with your health care provider. Document Revised: 07/08/2020 Document Reviewed: 07/08/2020 Elsevier Patient Education  2024 [employer]   If you have been instructed to have an in-person evaluation today at a local Urgent Care facility, please use the link below. It will take you to a list of all of our available [facility], including address, phone number and hours of operation.  Please do not delay care.  [facility]  If you or a family member do not have a primary care provider, use the link below to schedule a visit and establish care. When you choose a [facility] primary care physician or advanced practice provider, you gain a long-term partner in health. Find a Primary Care Provider  Learn more about [facility]'s in-office and virtual care options: [facility] - Get Care Now
